# Patient Record
Sex: Female | Born: 2000 | Race: White | Marital: Single | Smoking: Never smoker
Health system: Southern US, Community
[De-identification: ages and names within clinical notes are randomized; demographics above are authoritative.]

## PROBLEM LIST (undated history)

## (undated) DIAGNOSIS — R51 Headache: Secondary | ICD-10-CM

## (undated) DIAGNOSIS — R519 Headache, unspecified: Secondary | ICD-10-CM

## (undated) HISTORY — DX: Headache, unspecified: R51.9

## (undated) HISTORY — PX: WISDOM TOOTH EXTRACTION: SHX21

## (undated) HISTORY — DX: Headache: R51

---
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
## 2020-01-12 ENCOUNTER — Other Ambulatory Visit: Payer: Self-pay

## 2020-01-12 ENCOUNTER — Ambulatory Visit
Admission: RE | Admit: 2020-01-12 | Discharge: 2020-01-12 | Disposition: A | Discharge status: Home / Self Care | Payer: BC Managed Care – PPO | Source: Ambulatory Visit | Attending: Certified Nurse Midwife | Admitting: Certified Nurse Midwife

## 2020-01-12 DIAGNOSIS — N6320 Unspecified lump in the left breast, unspecified quadrant: Secondary | ICD-10-CM | POA: Insufficient documentation

## 2020-01-14 ENCOUNTER — Other Ambulatory Visit: Payer: Self-pay | Admitting: Certified Nurse Midwife

## 2020-01-14 DIAGNOSIS — N6320 Unspecified lump in the left breast, unspecified quadrant: Secondary | ICD-10-CM

## 2020-01-14 NOTE — Progress Notes (Signed)
Orders placed for follow u/s left breast  6 months as noted in results.   Philip Aspen, CNM

## 2020-01-31 ENCOUNTER — Encounter: Payer: Self-pay | Admitting: Certified Nurse Midwife

## 2020-01-31 ENCOUNTER — Other Ambulatory Visit: Payer: Self-pay

## 2020-01-31 ENCOUNTER — Ambulatory Visit (INDEPENDENT_AMBULATORY_CARE_PROVIDER_SITE_OTHER): Payer: BC Managed Care – PPO | Admitting: Certified Nurse Midwife

## 2020-01-31 VITALS — BP 124/86 | HR 78 | Ht 65.0 in | Wt 153.6 lb

## 2020-01-31 DIAGNOSIS — Z23 Encounter for immunization: Secondary | ICD-10-CM | POA: Diagnosis not present

## 2020-01-31 DIAGNOSIS — Z30431 Encounter for routine checking of intrauterine contraceptive device: Secondary | ICD-10-CM

## 2020-01-31 MED ORDER — TETANUS-DIPHTH-ACELL PERTUSSIS 5-2.5-18.5 LF-MCG/0.5 IM SUSY
0.5000 mL | PREFILLED_SYRINGE | Freq: Once | INTRAMUSCULAR | Status: AC
  Administered 2020-01-31: 11:00:00 0.5 mL via INTRAMUSCULAR

## 2020-01-31 NOTE — Progress Notes (Signed)
  GYNECOLOGY OFFICE ENCOUNTER NOTE  History:  19 y.o. G0P0000 here today for today for IUD string check; Mirena  IUD was placed  07/20/2019. She has experienced some intermittent cramping out side of her cycle and wanted to make sure IUD was ok. She states sometimes it is painful enough to use medication for pain.   The following portions of the patient's history were reviewed and updated as appropriate: allergies, current medications, past family history, past medical history, past social history, past surgical history and problem list. Last pap smear : n/a   Review of Systems:  Pertinent items are noted in HPI.   Objective:  Physical Exam Blood pressure 124/86, pulse 78, height 5\' 5"  (1.651 m), weight 153 lb 9 oz (69.7 kg). CONSTITUTIONAL: Well-developed, well-nourished female in no acute distress.  HENT:  Normocephalic, atraumatic. External right and left ear normal. Oropharynx is clear and moist EYES: Conjunctivae and EOM are normal. Pupils are equal, round, and reactive to light. No scleral icterus.  NECK: Normal range of motion, supple, no masses CARDIOVASCULAR: Normal heart rate noted RESPIRATORY: Effort and breath sounds normal, no problems with respiration noted ABDOMEN: Soft, no distention noted.   PELVIC: Normal appearing external genitalia; normal appearing vaginal mucosa and cervix.  IUD strings visualized, about 3 cm in length outside cervix.   Assessment & Plan:  U/s to verify IUD in correct place prn. Patient to keep IUD in place for up to seven years; can come in for removal if she desires pregnancy earlier or for any concerning side effects.   Philip Aspen, CNM

## 2020-01-31 NOTE — Patient Instructions (Signed)

## 2020-02-09 ENCOUNTER — Ambulatory Visit (INDEPENDENT_AMBULATORY_CARE_PROVIDER_SITE_OTHER): Payer: BC Managed Care – PPO

## 2020-02-09 ENCOUNTER — Other Ambulatory Visit: Payer: Self-pay

## 2020-02-09 DIAGNOSIS — Z30431 Encounter for routine checking of intrauterine contraceptive device: Secondary | ICD-10-CM | POA: Diagnosis not present

## 2020-05-26 DIAGNOSIS — N6320 Unspecified lump in the left breast, unspecified quadrant: Secondary | ICD-10-CM

## 2020-05-27 ENCOUNTER — Other Ambulatory Visit: Payer: Self-pay | Admitting: Certified Nurse Midwife

## 2020-05-27 DIAGNOSIS — Z09 Encounter for follow-up examination after completed treatment for conditions other than malignant neoplasm: Secondary | ICD-10-CM

## 2020-06-06 NOTE — Addendum Note (Signed)
Addended by: Chilton Greathouse on: 06/06/2020 10:58 AM   Modules accepted: Orders

## 2020-08-03 ENCOUNTER — Other Ambulatory Visit: Payer: BC Managed Care – PPO

## 2020-08-15 ENCOUNTER — Other Ambulatory Visit: Payer: BC Managed Care – PPO

## 2020-08-17 ENCOUNTER — Other Ambulatory Visit: Payer: BC Managed Care – PPO

## 2021-02-01 ENCOUNTER — Other Ambulatory Visit: Payer: Self-pay | Admitting: Dermatology

## 2021-10-29 IMAGING — US US BREAST*L* LIMITED INC AXILLA
1 series · 7 of 7 positions shown · non-contrast
Comparison: Previous exam(s).

CLINICAL DATA: Palpable area in the LEFT upper breast for 1 year.
History of fibroadenomas in the LEFT breast status post excision.

EXAM:
ULTRASOUND OF THE LEFT BREAST

[Series 1: us breast*left* limited inc axilla · 0.06mm/px · 7 of 7 slices shown]
[im 1/7]
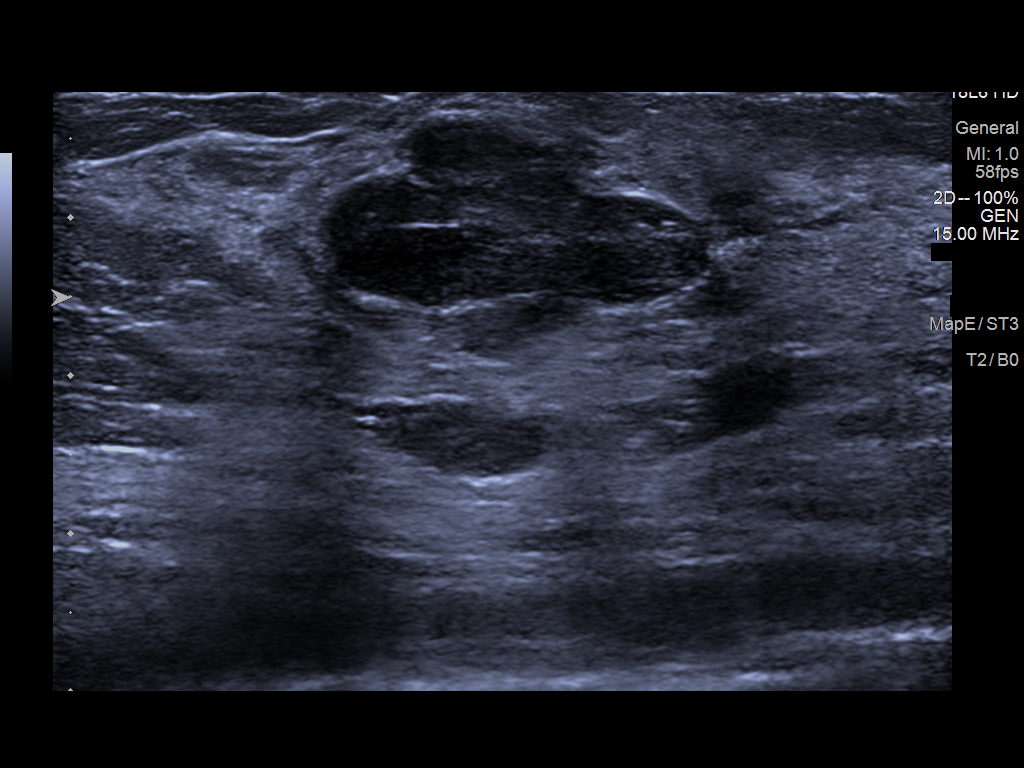
[im 2/7]
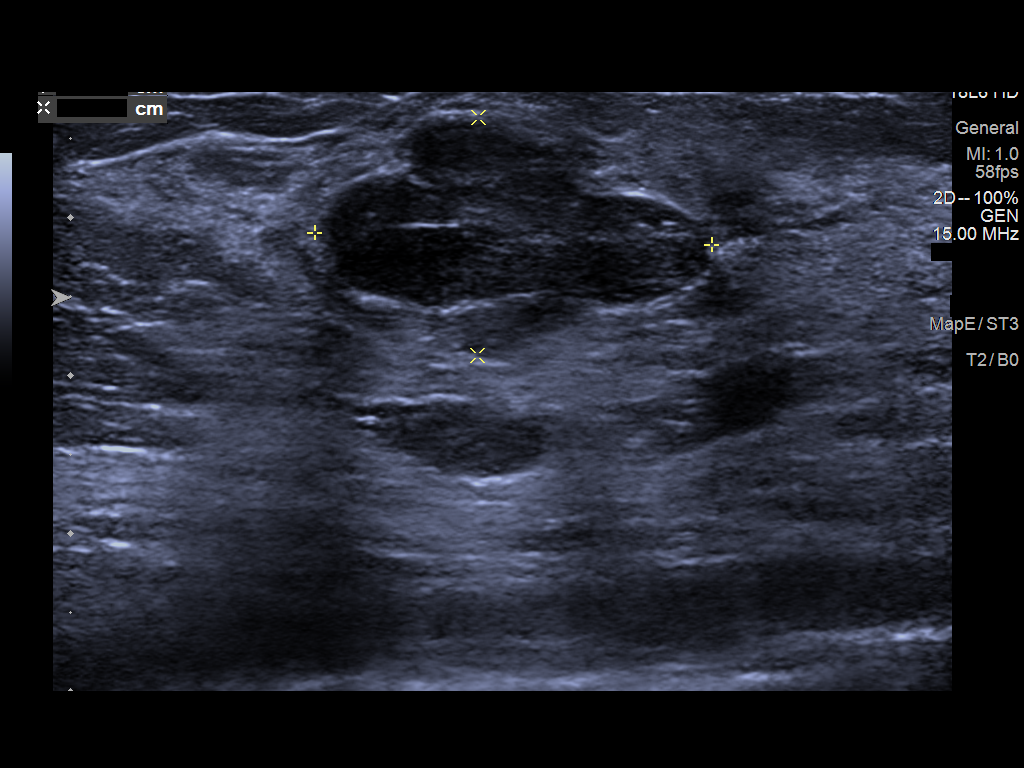
[im 3/7]
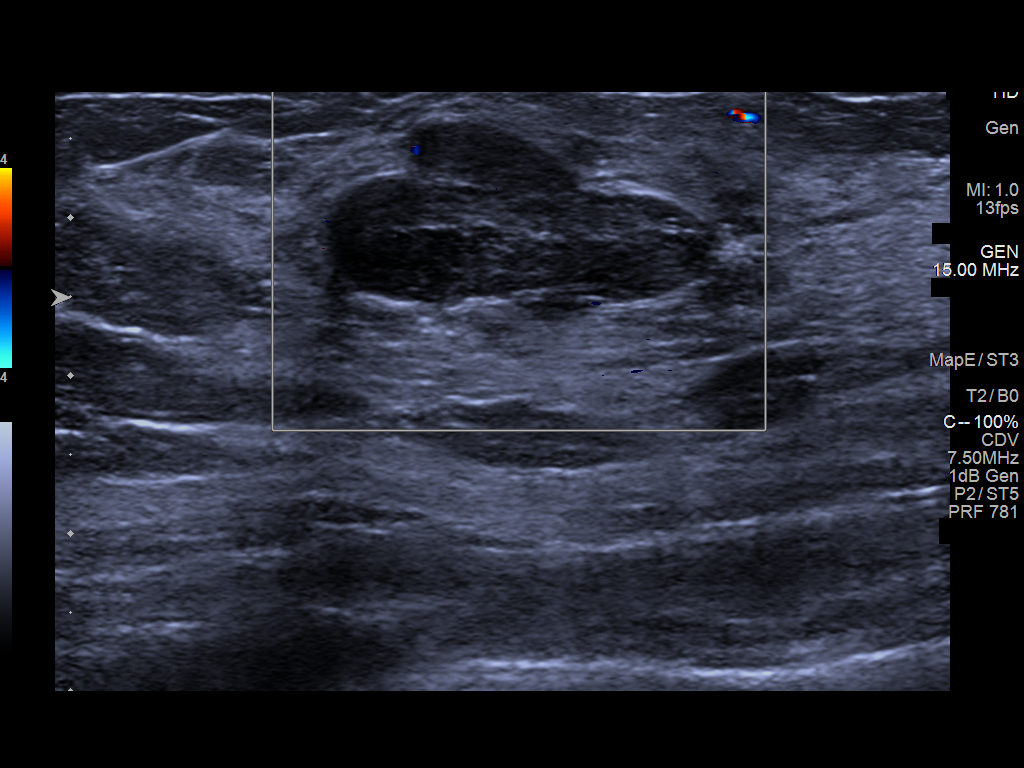
[im 4/7]
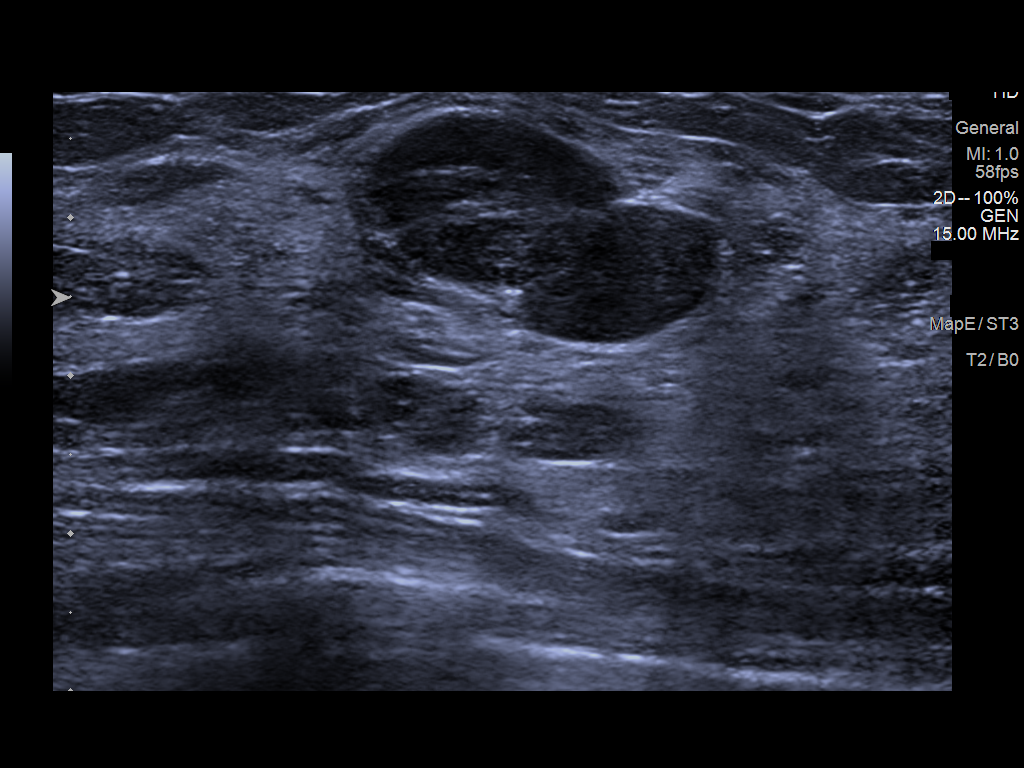
[im 5/7]
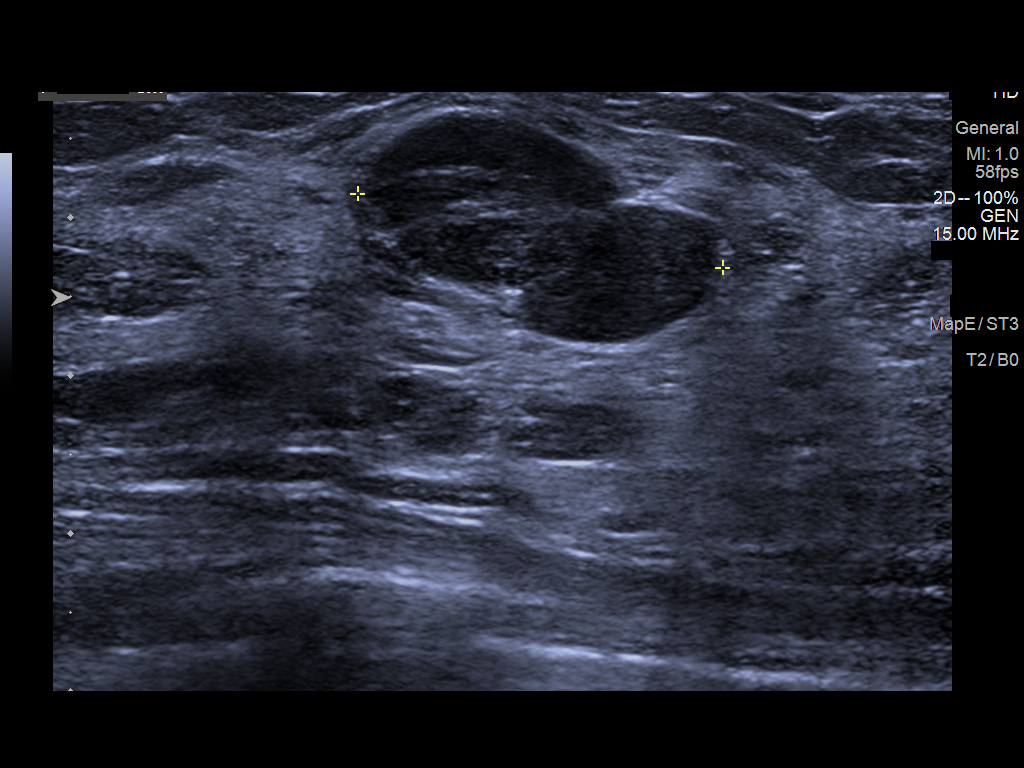
[im 6/7]
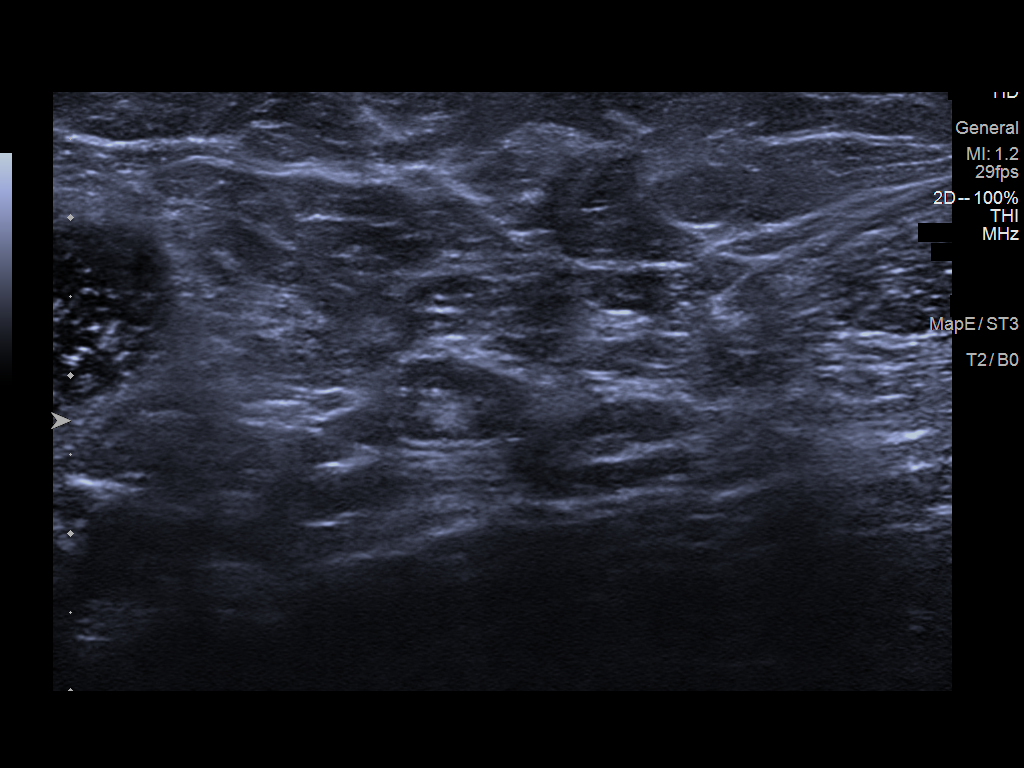
[im 7/7]
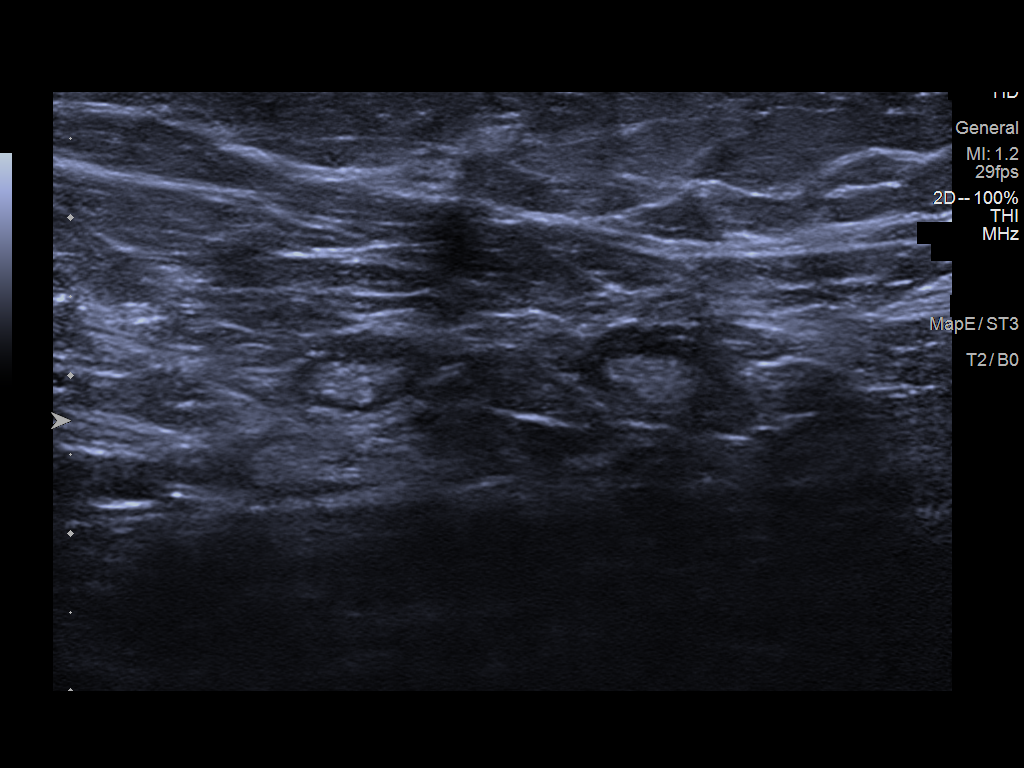

[7 of 7 positions shown; findings below may reference images not displayed]

FINDINGS: On physical exam, there is a soft mobile mass in the LEFT upper
breast.

Targeted ultrasound is performed of the site of palpable concern at
12 o'clock 3 cm from the nipple. There is an oval circumscribed
hypoechoic mass versus several adjacent circumscribed masses which
in total span 2.5 x 1.5 x 2.4 cm.

Targeted ultrasound was performed of the LEFT axilla. No suspicious
LEFT axillary lymph nodes are seen.
IMPRESSION: There is a probably benign 2.5 cm mass at the site of palpable
concern in the LEFT upper breast. This likely reflects an additional
fibroadenoma. Recommend follow-up ultrasound in 6 months to assess
for stability. This will establish 6 months of definitive stability.

RECOMMENDATION:
LEFT breast ultrasound in 6 months.

I have discussed the findings and recommendations with the patient.
If applicable, a reminder letter will be sent to the patient
regarding the next appointment.

BI-RADS CATEGORY  3: Probably benign.
